# Patient Record
Sex: Female | Born: 1955 | Race: White | Hispanic: No | Marital: Married | Smoking: Never smoker
Health system: Southern US, Community
[De-identification: ages and names within clinical notes are randomized; demographics above are authoritative.]

## PROBLEM LIST (undated history)

## (undated) DIAGNOSIS — I471 Supraventricular tachycardia, unspecified: Secondary | ICD-10-CM

## (undated) DIAGNOSIS — F419 Anxiety disorder, unspecified: Secondary | ICD-10-CM

## (undated) HISTORY — DX: Supraventricular tachycardia, unspecified: I47.10

## (undated) HISTORY — PX: OTHER SURGICAL HISTORY: SHX169

## (undated) HISTORY — DX: Supraventricular tachycardia: I47.1

## (undated) HISTORY — DX: Anxiety disorder, unspecified: F41.9

---

## 2020-05-26 ENCOUNTER — Other Ambulatory Visit: Payer: Self-pay | Admitting: Internal Medicine

## 2020-05-26 DIAGNOSIS — R000 Tachycardia, unspecified: Secondary | ICD-10-CM

## 2020-06-04 HISTORY — PX: TRANSTHORACIC ECHOCARDIOGRAM: SHX275

## 2020-06-10 ENCOUNTER — Other Ambulatory Visit: Payer: Self-pay

## 2020-06-17 ENCOUNTER — Encounter: Payer: Self-pay | Admitting: Cardiology

## 2020-06-17 ENCOUNTER — Ambulatory Visit (INDEPENDENT_AMBULATORY_CARE_PROVIDER_SITE_OTHER): Payer: 59 | Admitting: Cardiology

## 2020-06-17 ENCOUNTER — Other Ambulatory Visit: Payer: Self-pay

## 2020-06-17 VITALS — BP 138/74 | HR 65 | Ht 63.0 in | Wt 141.0 lb

## 2020-06-17 DIAGNOSIS — E785 Hyperlipidemia, unspecified: Secondary | ICD-10-CM | POA: Diagnosis not present

## 2020-06-17 DIAGNOSIS — R002 Palpitations: Secondary | ICD-10-CM

## 2020-06-17 NOTE — Patient Instructions (Addendum)
Medication Instructions:  no changes  *If you need a refill on your cardiac medications before your next appointment, please call your pharmacy*   Lab Work:  Not needed   Testing/Procedures:  CT coronary calcium score. This test is done at [address]. This is $99 out of pocket.   Coronary CalciumScan A coronary calcium scan is an imaging test used to look for deposits of calcium and other fatty materials (plaques) in the inner lining of the blood vessels of the heart (coronary arteries). These deposits of calcium and plaques can partly clog and narrow the coronary arteries without producing any symptoms or warning signs. This puts a person at risk for a heart attack. This test can detect these deposits before symptoms develop. Tell a health care provider about:  Any allergies you have.  All medicines you are taking, including vitamins, herbs, eye drops, creams, and over-the-counter medicines.  Any problems you or family members have had with anesthetic medicines.  Any blood disorders you have.  Any surgeries you have had.  Any medical conditions you have.  Whether you are pregnant or may be pregnant. What are the risks? Generally, this is a safe procedure. However, problems may occur, including:  Harm to a pregnant woman and her unborn baby. This test involves the use of radiation. Radiation exposure can be dangerous to a pregnant woman and her unborn baby. If you are pregnant, you generally should not have this procedure done.  Slight increase in the risk of cancer. This is because of the radiation involved in the test. What happens before the procedure? No preparation is needed for this procedure. What happens during the procedure?  You will undress and remove any jewelry around your neck or chest.  You will put on a hospital gown.  Sticky electrodes will be placed on your chest. The electrodes will be connected to an electrocardiogram (ECG)  machine to record a tracing of the electrical activity of your heart.  A CT scanner will take pictures of your heart. During this time, you will be asked to lie still and hold your breath for 2-3 seconds while a picture of your heart is being taken. The procedure may vary among health care providers and hospitals. What happens after the procedure?  You can get dressed.  You can return to your normal activities.  It is up to you to get the results of your test. Ask your health care provider, or the department that is doing the test, when your results will be ready. Summary  A coronary calcium scan is an imaging test used to look for deposits of calcium and other fatty materials (plaques) in the inner lining of the blood vessels of the heart (coronary arteries).  Generally, this is a safe procedure. Tell your health care provider if you are pregnant or may be pregnant.  No preparation is needed for this procedure.  A CT scanner will take pictures of your heart.  You can return to your normal activities after the scan is done. This information is not intended to replace advice given to you by your health care provider. Make sure you discuss any questions you have with your health care provider. Document Released: 09/24/2007 Document Revised: 02/15/2016 Document Reviewed: 02/15/2016 Elsevier Interactive Patient Education  2017 [employer].     Follow-Up: At [facility], you and your health needs are our priority.  As part of our continuing mission to provide you with exceptional heart care, we  have created designated Provider Care Teams.  These Care Teams include your primary Cardiologist (physician) and Advanced Practice Providers (APPs -  Physician Assistants and Nurse Practitioners) who all work together to provide you with the care you need, when you need it.  We recommend signing up for the patient portal called "MyChart".  Sign up information is provided on this After Visit  Summary.  MyChart is used to connect with patients for Virtual Visits (Telemedicine).  Patients are able to view lab/test results, encounter notes, upcoming appointments, etc.  Non-urgent messages can be sent to your provider as well.   To learn more about what you can do with MyChart, go to [email or web address].    Your next appointment:      1  month(s)  The format for your next appointment:   In Person  Provider:   Bryan Lemma, MD   Other Instructions  Will obtain the information from Dr Renne Crigler office - Zio , Cardiac CT scoring , echo disc

## 2020-06-17 NOTE — Progress Notes (Signed)
Primary Care Provider: Merri Brunette, MD Cardiologist: Bryan Lemma, MD Electrophysiologist: None  Clinic Note: Chief Complaint  Patient presents with  . New Patient (Initial Visit)    Referred by Dr. Renne Crigler for palpitations  . Palpitations    ===================================  ASSESSMENT/PLAN   Problem List Items Addressed This Visit    Palpitations - Primary    Unfortunately, I do not have the results of her monitor.  Her echo was totally normal.  I think one potential etiology for her palpitations is the fact that she is hyperdynamic left ventricle.  We talked at the importance of staying adequately hydrated.  We also advised avoiding triggers for palpitations.  Her symptoms sound to me like she is probably feeling some PVCs and then with short little bursts of PAT or atrial tachycardia.  The term SVT is probably not the correct term to use since traditionally "SVTs" are usually more sustained reentrant rhythms such as AVRT or AVNRT which usually last more than just a few seconds.  Plan: Continue recommended hydration.  We will get the results of her monitor to review and follow-up..      Relevant Orders   EKG 12-Lead (Completed)   CT CARDIAC SCORING (SELF PAY ONLY)   Dyslipidemia, goal to be determined    PCP suggested coronary calcium score.  She is also debating whether or not she gets the executive panel of carotid Dopplers, AAA screen and lower extremity arterial screening. I think probably the first reasonable screening test for coronary disease risk is a coronary calcium score.  Once that is checked, we can discuss further studies which may be beneficial.  For now, she is agreeable to proceed with coronary calcium score.    She will return to discuss results of the coronary calcium score and the monitor once I have the results.         ===================================  HPI:    Kelli Taylor is a 65 y.o. female with a no really significant  medical history who presents today for evaluation of PALPITATIONS at the request of Merri Brunette, MD.  Kelli Taylor was seen on May 26, 2020 by Dr. Renne Crigler with complaints of palpitation.  She is noticing these for about 3 years but may be getting worse.  Was having some fast heart rates associated dizziness. => CMP, TSH, CBC and lipid panel checked along with echocardiogram and Zio patch monitor.  Coronary calcium score ordered, but she wanted to talk to cardiologist first. -> Unfortunately, Zio patch results are not available.  She was started on sertraline 50 mg daily  Recent Hospitalizations: None  Reviewed  CV studies:    The following studies were reviewed today: (if available, images/films reviewed: From Epic Chart or Care Everywhere) . Transthoracic Echo [facility]) 06/04/2020: Normal left ventricular size and function.  EF estimated 7075%.  GR 1 DD.  Mild LA dilation.  Mild aortic sclerosis, no stenosis.  Although valve not well seen.  Mitral valve is structurally normal with trace MR, mild leaflet calcification but no stenosis and no mitral valve prolapse.   Interval History:   Kelli Taylor presents here today at the request of Dr. Renne Crigler.  She has multiple questions complaints and concerns.  She has had several tests done.  Unfortunately I do not have the results of her Zio patch done.  Also noted that there is suggestion of possible SVT.  I suspect that this is actually probably just runs  of PAT.  This is based on her description of symptoms.  I spent the better part 45 minutes explaining the pathophysiology of PVCs, PACs, PAT, PAF, atrial flutter, SVT and VT.  We reviewed the results of her echocardiogram together.  I explained to her that she had previously had a diagnosis of mitral prolapse which was not supported by this echocardiogram.  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  Several complaints:  Feelings of palpitations lasting a few seconds.  It is more of a pounding sensation, associated with dizziness.  Sometimes the heart rate goes fast but most time is just very forceful beats.  The fast spells do not last very long.  She is able to usually break them by pushing on her radial artery.  This usually resets her rhythm. =>   These episodes are not associated with chest pain or shortness of breath.  Just a little bit of dizziness.  She says these episodes of become more persistent and and worrisome as he has gotten older, but usually associated with anxiety.   She is also concerned about possible ischemic etiologies of the symptoms, Dr. Renne CriglerPharr had recommended screening studies with coronary calcium score, and she is deciding whether or not she wants to try to do one of the executive panel to look at arterial Dopplers.  We discussed the studies and what they mean.  A lot of her anxiety revolves around her being concerned about her mother's health now.  She also has had concerns about work, the pandemic etc.  She is not doing the routine exercise type activities he was doing before therefore feeling more sedentary.  She is usually pretty active, but not exercising routinely.  She denies any active chest tightness or pressure symptoms.  No PND orthopnea or edema.  Although she has these pounding heart sensations and fluttering, but nothing is prolonged.  Nothing necessarily irregular.  Besides feeling a little bit lightheaded and anxious about these symptoms, she is not really having syncope or near syncope.  CV Review of Symptoms (Summary): positive for - irregular heartbeat, palpitations, rapid heart rate and Short bursts of fast heart rates, mostly forceful pounding sensation in her chest.  Made worse with anxiousness.  She sometimes gets short of breath but not  necessarily dizzy or woozy. negative for - chest pain, dyspnea on exertion, edema, orthopnea, paroxysmal nocturnal dyspnea, shortness of breath or Syncope/near syncope, TIA/amaurosis fugax, claudication  The patient does not have symptoms concerning for COVID-19 infection (fever, chills, cough, or new shortness of breath).   REVIEWED OF SYSTEMS   Review of Systems  Constitutional: Negative for malaise/fatigue and weight loss.  HENT: Negative for congestion.   Respiratory: Negative for cough and shortness of breath.   Cardiovascular: Positive for palpitations.  Gastrointestinal: Negative for blood in stool and melena.  Genitourinary: Negative for hematuria.  Musculoskeletal: Negative for joint pain.  Neurological: Positive for dizziness. Negative for focal weakness, weakness and headaches.  Psychiatric/Behavioral: Negative for depression and memory loss. The patient is nervous/anxious. The patient does not have insomnia.     I have reviewed and (if needed) personally updated the patient's problem list, medications, allergies, past medical and surgical history, social and family history.   PAST MEDICAL HISTORY   Past Medical History:  Diagnosis Date  . Anxiety disorder   .  Dyslipidemia, goal to be determined 06/18/2020  . SVT (supraventricular tachycardia) (HCC)    By report    PAST SURGICAL HISTORY   Past Surgical History:  Procedure Laterality Date  . None       There is no immunization history on file for this patient.  MEDICATIONS/ALLERGIES   Current Meds  Medication Sig  . Calcium-Cholecalciferol-Zinc (VIACTIV CALCIUM IMMUNE PO) Take by mouth.  . Multiple Vitamin (MULTIVITAMIN) tablet Take 1 tablet by mouth daily.  . multivitamin-lutein (OCUVITE-LUTEIN) CAPS capsule Take 1 capsule by mouth daily.  . [DISCONTINUED] sertraline (ZOLOFT) 50 MG tablet 1/2  tablet for 4 days then 1 each morning    Allergies  Allergen Reactions  . Cinnamon Swelling    SOCIAL  HISTORY/FAMILY HISTORY   Reviewed in Epic:  Pertinent findings:  Social History   Tobacco Use  . Smoking status: Never Smoker  . Smokeless tobacco: Never Used  Substance Use Topics  . Alcohol use: Not Currently    Comment: Rare social use   Social History   Social History Narrative  . Not on file   family history includes COPD in her mother; Diabetes Mellitus II in her father; Heart disease in her father; Hypercholesterolemia in her sister.   OBJCTIVE -PE, EKG, labs   Wt Readings from Last 3 Encounters:  06/17/20 141 lb (64 kg)    Physical Exam: BP 138/74   Pulse 65   Ht 5\' 3"  (1.6 m)   Wt 141 lb (64 kg)   SpO2 98%   BMI 24.98 kg/m  Physical Exam Constitutional:      General: She is not in acute distress.    Appearance: Normal appearance. She is normal weight. She is not ill-appearing or toxic-appearing.  HENT:     Head: Normocephalic and atraumatic.  Neck:     Vascular: No carotid bruit.  Cardiovascular:     Rate and Rhythm: Normal rate and regular rhythm.     Pulses: Normal pulses.     Heart sounds: Normal heart sounds. No murmur heard. No friction rub. No gallop.   Pulmonary:     Effort: Pulmonary effort is normal. No respiratory distress.     Breath sounds: Normal breath sounds.  Chest:     Chest wall: No tenderness.  Abdominal:     General: Abdomen is flat. Bowel sounds are normal. There is no distension.     Palpations: Abdomen is soft. There is no mass.     Comments: No HSM or bruit  Musculoskeletal:        General: No swelling or tenderness. Normal range of motion.     Cervical back: Normal range of motion and neck supple.  Skin:    General: Skin is warm and dry.  Neurological:     General: No focal deficit present.     Mental Status: She is alert and oriented to person, place, and time.     Cranial Nerves: No cranial nerve deficit.     Motor: No weakness.     Gait: Gait normal.  Psychiatric:        Mood and Affect: Mood normal.         Thought Content: Thought content normal.     Comments: Very anxious, almost pressured speech.     Adult ECG Report  Rate: 65 ;  Rhythm: normal sinus rhythm and Possible left atrial enlargement.  (Computer read of cannot rule out anterior infarct, age-indeterminate is not accurate); normal axis, intervals and durations.  Narrative  Interpretation: Borderline EKG.  PCP EKG shows sinus rhythm, rate 90 bpm.  Questionable left atrial enlargement.  Otherwise essentially normal EKG.  Recent Labs: 05/26/2020  Na+ 142, K+ 4.2, Cl- 104, HCO3-21, BUN 16, Cr 0.76, Glu 90, Ca2+ 9.6; AST 57, ALT 18, AlkP 19  CBC: W 11.1, H/H 14.2/42.9, Plt 374; TSH 1.37  TC 194, TG 127, HDL 31.7 (HDL-P total 31.7 -normal), LDL 107; LDL-P 1239 (high), small LDL-P4 404 (target range<527.  LDL size 20.8 (goal> 20.5)  No results found for: CHOL, HDL, LDLCALC, LDLDIRECT, TRIG, CHOLHDL No results found for: CREATININE, BUN, NA, K, CL, CO2 No flowsheet data found.  No results found for: TSH  ==================================================  COVID-19 Education: The signs and symptoms of COVID-19 were discussed with the patient and how to seek care for testing (follow up with PCP or arrange E-visit).   The importance of social distancing and COVID-19 vaccination was discussed today. The patient is practicing social distancing & Masking.   I spent a total of with the patient spent in direct patient consultation.  Additional time spent with chart review  / charting (studies, outside notes, etc): 35 min Total Time: 80 min   Current medicines are reviewed at length with the patient today.  (+/- concerns) n/a  This visit occurred during the SARS-CoV-2 public health emergency.  Safety protocols were in place, including screening questions prior to the visit, additional usage of staff PPE, and extensive cleaning of exam room while observing appropriate contact time as indicated for disinfecting  solutions.  Notice: This dictation was prepared with Dragon dictation along with smaller phrase technology. Any transcriptional errors that result from this process are unintentional and may not be corrected upon review.  Patient Instructions / Medication Changes & Studies & Tests Ordered   Patient Instructions  Medication Instructions:  no changes  *If you need a refill on your cardiac medications before your next appointment, please call your pharmacy*   Lab Work:  Not needed   Testing/Procedures:  CT coronary calcium score. This test is done at [address]. This is $99 out of pocket.   Coronary CalciumScan A coronary calcium scan is an imaging test used to look for deposits of calcium and other fatty materials (plaques) in the inner lining of the blood vessels of the heart (coronary arteries). These deposits of calcium and plaques can partly clog and narrow the coronary arteries without producing any symptoms or warning signs. This puts a person at risk for a heart attack. This test can detect these deposits before symptoms develop. Tell a health care provider about:  Any allergies you have.  All medicines you are taking, including vitamins, herbs, eye drops, creams, and over-the-counter medicines.  Any problems you or family members have had with anesthetic medicines.  Any blood disorders you have.  Any surgeries you have had.  Any medical conditions you have.  Whether you are pregnant or may be pregnant. What are the risks? Generally, this is a safe procedure. However, problems may occur, including:  Harm to a pregnant woman and her unborn baby. This test involves the use of radiation. Radiation exposure can be dangerous to a pregnant woman and her unborn baby. If you are pregnant, you generally should not have this procedure done.  Slight increase in the risk of cancer. This is because of the radiation involved in the test. What happens before  the procedure? No preparation is needed for this procedure. What happens during the  procedure?  You will undress and remove any jewelry around your neck or chest.  You will put on a hospital gown.  Sticky electrodes will be placed on your chest. The electrodes will be connected to an electrocardiogram (ECG) machine to record a tracing of the electrical activity of your heart.  A CT scanner will take pictures of your heart. During this time, you will be asked to lie still and hold your breath for 2-3 seconds while a picture of your heart is being taken. The procedure may vary among health care providers and hospitals. What happens after the procedure?  You can get dressed.  You can return to your normal activities.  It is up to you to get the results of your test. Ask your health care provider, or the department that is doing the test, when your results will be ready. Summary  A coronary calcium scan is an imaging test used to look for deposits of calcium and other fatty materials (plaques) in the inner lining of the blood vessels of the heart (coronary arteries).  Generally, this is a safe procedure. Tell your health care provider if you are pregnant or may be pregnant.  No preparation is needed for this procedure.  A CT scanner will take pictures of your heart.  You can return to your normal activities after the scan is done. This information is not intended to replace advice given to you by your health care provider. Make sure you discuss any questions you have with your health care provider. Document Released: 09/24/2007 Document Revised: 02/15/2016 Document Reviewed: 02/15/2016 Elsevier Interactive Patient Education  2017 [employer].     Follow-Up: At [facility], you and your health needs are our priority.  As part of our continuing mission to provide you with exceptional heart care, we have created designated Provider Care Teams.  These Care Teams include your  primary Cardiologist (physician) and Advanced Practice Providers (APPs -  Physician Assistants and Nurse Practitioners) who all work together to provide you with the care you need, when you need it.  We recommend signing up for the patient portal called "MyChart".  Sign up information is provided on this After Visit Summary.  MyChart is used to connect with patients for Virtual Visits (Telemedicine).  Patients are able to view lab/test results, encounter notes, upcoming appointments, etc.  Non-urgent messages can be sent to your provider as well.   To learn more about what you can do with MyChart, go to [email or web address].    Your next appointment:      1  month(s)  The format for your next appointment:   In Person  Provider:   Bryan Lemma, MD   Other Instructions  Will obtain the information from Dr Renne Crigler office - [facility] , Cardiac CT scoring , echo disc     Studies Ordered:   Orders Placed This Encounter  Procedures  . CT CARDIAC SCORING (SELF PAY ONLY)  . EKG 12-Lead     Bryan Lemma, M.D., M.S. Interventional Cardiologist   Pager # [phone] Phone # [phone] [address] [place], [place] [address]   Thank you for choosing Heart[facility] at [facility]!!

## 2020-06-18 ENCOUNTER — Encounter: Payer: Self-pay | Admitting: Cardiology

## 2020-06-18 DIAGNOSIS — R002 Palpitations: Secondary | ICD-10-CM | POA: Insufficient documentation

## 2020-06-18 DIAGNOSIS — E785 Hyperlipidemia, unspecified: Secondary | ICD-10-CM

## 2020-06-18 HISTORY — DX: Hyperlipidemia, unspecified: E78.5

## 2020-06-18 NOTE — Assessment & Plan Note (Signed)
PCP suggested coronary calcium score.  She is also debating whether or not she gets the executive panel of carotid Dopplers, AAA screen and lower extremity arterial screening. I think probably the first reasonable screening test for coronary disease risk is a coronary calcium score.  Once that is checked, we can discuss further studies which may be beneficial.  For now, she is agreeable to proceed with coronary calcium score.    She will return to discuss results of the coronary calcium score and the monitor once I have the results.

## 2020-06-18 NOTE — Assessment & Plan Note (Signed)
Unfortunately, I do not have the results of her monitor.  Her echo was totally normal.  I think one potential etiology for her palpitations is the fact that she is hyperdynamic left ventricle.  We talked at the importance of staying adequately hydrated.  We also advised avoiding triggers for palpitations.  Her symptoms sound to me like she is probably feeling some PVCs and then with short little bursts of PAT or atrial tachycardia.  The term SVT is probably not the correct term to use since traditionally "SVTs" are usually more sustained reentrant rhythms such as AVRT or AVNRT which usually last more than just a few seconds.  Plan: Continue recommended hydration.  We will get the results of her monitor to review and follow-up.Marland Kitchen

## 2020-06-26 ENCOUNTER — Ambulatory Visit (INDEPENDENT_AMBULATORY_CARE_PROVIDER_SITE_OTHER)
Admission: RE | Admit: 2020-06-26 | Discharge: 2020-06-26 | Disposition: A | Discharge status: Home / Self Care | Payer: Self-pay | Source: Ambulatory Visit | Attending: Cardiology | Admitting: Cardiology

## 2020-06-26 ENCOUNTER — Other Ambulatory Visit: Payer: Self-pay

## 2020-06-26 DIAGNOSIS — R002 Palpitations: Secondary | ICD-10-CM

## 2020-06-26 HISTORY — PX: OTHER SURGICAL HISTORY: SHX169

## 2020-07-03 ENCOUNTER — Telehealth: Payer: Self-pay | Admitting: *Deleted

## 2020-07-03 NOTE — Telephone Encounter (Signed)
Called  Informed patient that Dr Herbie Baltimore has received the report and echo disc  Once he  Gives the RN information will contact patient.  she verbalized understanding.

## 2020-07-03 NOTE — Telephone Encounter (Signed)
-----   Message from Charna Elizabeth, LPN sent at 2/62/0355 12:22 PM EDT ----- Jasmine December this pt wanted to know if you received her zio monitor and echo disc

## 2020-07-06 NOTE — Telephone Encounter (Signed)
Trying to find a machine that can actually open up the images. Bryan Lemma, MD

## 2020-08-24 ENCOUNTER — Encounter: Payer: Self-pay | Admitting: Cardiology

## 2020-08-24 ENCOUNTER — Ambulatory Visit (INDEPENDENT_AMBULATORY_CARE_PROVIDER_SITE_OTHER): Payer: 59 | Admitting: Cardiology

## 2020-08-24 ENCOUNTER — Other Ambulatory Visit: Payer: Self-pay

## 2020-08-24 VITALS — BP 126/68 | HR 71 | Ht 60.0 in | Wt 140.0 lb

## 2020-08-24 DIAGNOSIS — R002 Palpitations: Secondary | ICD-10-CM

## 2020-08-24 DIAGNOSIS — E785 Hyperlipidemia, unspecified: Secondary | ICD-10-CM | POA: Diagnosis not present

## 2020-08-24 NOTE — Patient Instructions (Addendum)
.  Medication Instructions:  No changes  *If you need a refill on your cardiac medications before your next appointment, please call your pharmacy*   Lab Work:  [facility] LIPID- fasting  Dec 2022 If you have labs (blood work) drawn today and your tests are completely normal, you will receive your results only by: Marland Kitchen MyChart Message (if you have MyChart) OR . A paper copy in the mail If you have any lab test that is abnormal or we need to change your treatment, we will call you to review the results.   Testing/Procedures:  Not needed  Follow-Up: At [facility], you and your health needs are our priority.  As part of our continuing mission to provide you with exceptional heart care, we have created designated Provider Care Teams.  These Care Teams include your primary Cardiologist (physician) and Advanced Practice Providers (APPs -  Physician Assistants and Nurse Practitioners) who all work together to provide you with the care you need, when you need it.     Your next appointment:   6 month(s)  The format for your next appointment:   In Person  Provider:   Bryan Lemma, MD   Other Instructions Your physician discussed the importance of regular exercise and diet  recommended that you start or continue a regular exercise program for good health.   think about  VASCUscreen   Screening exams  For  Carotid artery disease, Abdominal Aortic Aneurysm, Peripheral Artery Disease  $50 per exam or $125 for all ( no referral necessary, 30 minute exam)

## 2020-08-24 NOTE — Progress Notes (Signed)
Primary Care Provider: Merri Brunette, MD Cardiologist: Bryan Lemma, MD Electrophysiologist: None  Clinic Note: Chief Complaint  Patient presents with   Follow-up    Overall doing better.  Less notable palpitations.    ===================================  ASSESSMENT/PLAN   Problem List Items Addressed This Visit     Palpitations (Chronic)    Review of the monitor indicates that indeed these are probably not true SVT spells.  6 short episodes with the longest 1 being 17 beats would be more consistent with atrial tachycardia bursts.  SVT tends to be more sustained and prolonged.  Regardless, we discussed the importance of avoiding triggers and vagal maneuvers to avoid prolonged spells.  Also encouraged adequate hydration.  Based on the fact that her symptoms have improved and are not likely significant arrhythmias, I would suggest that we do not consider AV nodal agents at this time.       Relevant Orders   Lipid panel   Comprehensive metabolic panel   Dyslipidemia, goal LDL below 100 - Primary (Chronic)    Coronary calcium score is extremely low.  As such, she would be considered relatively low risk and target LDL is not unreasonable to be less than 130, however based on the patient's concerns, I would target at least LDL less than 100 with a goal of reaching less than 70.  Her LDL size is a stable pattern but just barely.  For now, she would like a trial of lifestyle modification with adjusting her diet.  She will try potentially red yeast rice and co-Q10 along with diet and exercise.  We will recheck screening fasting lipid panel in December.  If not at goal by that time, I would consider low-dose statin.  Would not recommend aspirin.  She is also going to consider going forward with the vascular screening studies including carotid Dopplers, AAA screen and ABIs.       Relevant Orders   Lipid panel   Comprehensive metabolic panel     ===================================  HPI:    Kelli Taylor is a 65 y.o. female with a PMH notable for nonsustained PAT/SVT on monitor, and borderline  HLD who presents today for 46-month follow-up.  Kelli Taylor was last seen on June 17, 2020 for initial consultation for palpitations.  At that time I did not have her Zio patch monitor results, and there was report of possible SVT--mention that her palpitations would last a few seconds with a feeling of forceful beats and sometimes fast heart rate.  Sometimes associated with dizziness..  She also had an echocardiogram performed at Dr. Carolee Rota office reviewed below past surgical history.  Unfortunately I was not able to pull up the images from the provided CD. -> Would prefer to avoid medications for her palpitations as they were relatively benign.  Discussed importance of hydration and avoiding triggers.  Discussed vagal maneuvers. -> She was concerned about her lipids.  She was concerned about getting a cardiovascular screening done.  In order to help her stratify we chose to proceed with coronary calcium score.  She is still considering carotid Dopplers and AAA screening along with ABIs.  (VASC screen)  Recent Hospitalizations: None  Reviewed  CV studies:    The following studies were reviewed today: (if available, images/films reviewed: From Epic Chart or Care Everywhere) Coronary Calcium Score 06/26/2020: CCS 0.861 - speck of calcium in the mid LCx. -- 55th percentile for age.    Interval History:   Kelli Taylor returns here  today actually feeling a lot better.  The palpitations have been notably improved and she has not anything prolonged.  She does indicate that her anxiety level and stress has notably improved and that may be part of the reason why her palpitations have improved.  She is watching out for triggers, avoiding caffeine.  She may notice a few skipped beats or pounding symptoms, but no tachycardia symptoms.  No rapid  or irregular heartbeats. She did suffer a fall where she lost her balance going up the stairs, but has not had any significant injury from that.  Otherwise she denies any active cardiac symptoms or exertional dyspnea or chest pain.  No PND, orthopnea or edema.  Although she had a fall, she had no syncope or near syncope.  No TIA or amaurosis fugax symptoms.   REVIEWED OF SYSTEMS   Review of Systems  Constitutional:  Negative for malaise/fatigue and weight loss.  HENT:  Negative for congestion and nosebleeds.   Respiratory:  Negative for cough and shortness of breath.   Cardiovascular:  Positive for palpitations (Notably improved).  Gastrointestinal:  Negative for blood in stool and melena.  Genitourinary:  Negative for hematuria.  Musculoskeletal:  Positive for falls (Noted in HPI.). Negative for back pain and joint pain.       She has some discomfort from her fall, but no major injuries  Neurological:  Negative for dizziness, focal weakness and headaches.  Psychiatric/Behavioral:  Negative for depression and memory loss. The patient is nervous/anxious (Notably improved). The patient does not have insomnia.    I have reviewed and (if needed) personally updated the patient's problem list, medications, allergies, past medical and surgical history, social and family history.   PAST MEDICAL HISTORY   Past Medical History:  Diagnosis Date   Anxiety disorder    Dyslipidemia, goal to be determined 06/18/2020   Paroxysmal - nonsustained  SVT (supraventricular tachycardia) (HCC)    7-DAY ZIO PATCH CARDIAC EVENT MONITOR: 6 brief bursts of SVT/PAT.  Fastest was 9 beats at a rate of 156 bpm, longest 17 beats rate 137 bpm. --> NO SUSTAINED TACHYARRHYTHMIAS.(i.e. no SUSTAINTED SVT / PAT).  Rare isolated PACs or PVCs.  Short bursts of PAT were not noted on diary or triggered patient events.    PAST SURGICAL HISTORY   Past Surgical History:  Procedure Laterality Date   CORONARY CALCIUM SCORE   06/26/2020   CCS 0.861 --> LOW.  speck of Ca2+ noted in mLCx. ~ 55th Percentile.   None     TRANSTHORACIC ECHOCARDIOGRAM  06/04/2020   [facility]): Normal left ventricular size and function.  EF estimated 7075%.  GR 1 DD.  Mild LA dilation.  Mild aortic sclerosis, no stenosis.  Although valve not well seen.  Mitral valve is structurally normal with trace MR, mild leaflet calcification but no stenosis and no mitral valve prolapse.    There is no immunization history on file for this patient.  MEDICATIONS/ALLERGIES   Current Meds  Medication Sig   Calcium-Cholecalciferol-Zinc (VIACTIV CALCIUM IMMUNE PO) Take by mouth.   Multiple Vitamin (MULTIVITAMIN) tablet Take 1 tablet by mouth daily.   Multiple Vitamins-Minerals (EMERGEN-C BLUE) PACK See admin instructions.   multivitamin-lutein (OCUVITE-LUTEIN) CAPS capsule Take 1 capsule by mouth daily.   Pseudophed-Chlophedianol-GG 30-12.5-100 MG/5ML LIQD Take by mouth.    Allergies  Allergen Reactions   Cinnamon Swelling    SOCIAL HISTORY/FAMILY HISTORY   Reviewed in Epic:  Pertinent findings:  Social History   Tobacco Use  Smoking status: Never   Smokeless tobacco: Never  Substance Use Topics   Alcohol use: Not Currently    Comment: Rare social use   Social History   Social History Narrative   Not on file    OBJCTIVE -PE, EKG, labs   Wt Readings from Last 3 Encounters:  08/24/20 140 lb (63.5 kg)  06/17/20 141 lb (64 kg)    Physical Exam: BP 126/68   Pulse 71   Ht 5' (1.524 m)   Wt 140 lb (63.5 kg)   SpO2 97%   BMI 27.34 kg/m  Physical Exam Vitals reviewed.  Constitutional:      General: She is not in acute distress.    Appearance: Normal appearance. She is normal weight. She is not ill-appearing or toxic-appearing.  HENT:     Head: Normocephalic and atraumatic.  Neck:     Vascular: No carotid bruit, hepatojugular reflux or JVD.  Cardiovascular:     Rate and Rhythm: Normal rate and  regular rhythm. No extrasystoles are present.    Chest Wall: PMI is not displaced.     Pulses: Normal pulses.     Heart sounds: No midsystolic click. No murmur heard.   No friction rub. No gallop.  Pulmonary:     Effort: Pulmonary effort is normal. No respiratory distress.     Breath sounds: Normal breath sounds. No wheezing, rhonchi or rales.  Chest:     Chest wall: No tenderness.  Musculoskeletal:        General: No swelling. Normal range of motion.     Cervical back: Normal range of motion and neck supple.  Skin:    General: Skin is warm and dry.  Neurological:     General: No focal deficit present.     Mental Status: She is alert and oriented to person, place, and time.  Psychiatric:        Mood and Affect: Mood normal.        Behavior: Behavior normal.        Thought Content: Thought content normal.        Judgment: Judgment normal.     Adult ECG Report N/a  Recent Labs:   05/27/2020: LDL-P 1239 (moderate), LDL-C (NIH-Calc) 107 (above optimal), Small LDL-P 404 (goal < 527), LDL Size 20.8 (goal 20.5 -> pattern A)  HDL-C 65, HDL-P (total) 31.7 (goal >30.5).  TG 127, TC 194.  LP-IR score <25 (low insulin resistance score) 08/21/2020: TC 185, TG 61, HDL 67, LDL 106   No results found for: CHOL, HDL, LDLCALC, LDLDIRECT, TRIG, CHOLHDL No results found for: CREATININE, BUN, NA, K, CL, CO2 No flowsheet data found.  No results found for: TSH  ==================================================  COVID-19 Education: The signs and symptoms of COVID-19 were discussed with the patient and how to seek care for testing (follow up with PCP or arrange E-visit).    I spent a total of 41 minutes with the patient spent in direct patient consultation.  --> Patient had multiple questions.  Despite feeling much better, she is very concerned about her underlying cardiovascular risk.  We reviewed her lipid panels and discussed whether not she should be on a statin.  I discussed with her that her  coronary calcium score is extremely low, and would not necessarily recommend aspirin, but treating for an LDL goal of less than 100 if not closer to 70 is always reasonable target to avoid progression of disease. Additional time spent with chart review  / charting (studies, outside  notes, etc): 15 min -> Coronary calcium score, event monitor and labs reviewed.  History updated Total Time: 56 min   Current medicines are reviewed at length with the patient today.  (+/- concerns) n/a  This visit occurred during the SARS-CoV-2 public health emergency.  Safety protocols were in place, including screening questions prior to the visit, additional usage of staff PPE, and extensive cleaning of exam room while observing appropriate contact time as indicated for disinfecting solutions.  Notice: This dictation was prepared with Dragon dictation along with smaller phrase technology. Any transcriptional errors that result from this process are unintentional and may not be corrected upon review.  Patient Instructions / Medication Changes & Studies & Tests Ordered   Patient Instructions  .Medication Instructions:  No changes  *If you need a refill on your cardiac medications before your next appointment, please call your pharmacy*   Lab Work:  [facility] LIPID- fasting  Dec 2022 If you have labs (blood work) drawn today and your tests are completely normal, you will receive your results only by: MyChart Message (if you have MyChart) OR A paper copy in the mail If you have any lab test that is abnormal or we need to change your treatment, we will call you to review the results.   Testing/Procedures:  Not needed  Follow-Up: At [facility], you and your health needs are our priority.  As part of our continuing mission to provide you with exceptional heart care, we have created designated Provider Care Teams.  These Care Teams include your primary Cardiologist (physician) and Advanced Practice Providers  (APPs -  Physician Assistants and Nurse Practitioners) who all work together to provide you with the care you need, when you need it.     Your next appointment:   6 month(s)  The format for your next appointment:   In Person  Provider:   Bryan Lemmaavid Yevette Knust, MD   Other Instructions Your physician discussed the importance of regular exercise and diet  recommended that you start or continue a regular exercise program for good health.   think about  VASCUscreen   Screening exams  For  Carotid artery disease, Abdominal Aortic Aneurysm, Peripheral Artery Disease  $50 per exam or $125 for all ( no referral necessary, 30 minute exam)    Studies Ordered:   Orders Placed This Encounter  Procedures   Lipid panel   Comprehensive metabolic panel     Bryan Lemmaavid Shelsy Seng, M.D., M.S. Interventional Cardiologist   Pager # [phone] Phone # [phone] [address] [place], [place] [address]   Thank you for choosing [facility] at [facility]!!

## 2020-08-25 NOTE — Telephone Encounter (Signed)
Patient discuss with patient at office appointment 08/24/20

## 2020-09-17 ENCOUNTER — Encounter: Payer: Self-pay | Admitting: Cardiology

## 2020-09-17 NOTE — Assessment & Plan Note (Signed)
Coronary calcium score is extremely low.  As such, she would be considered relatively low risk and target LDL is not unreasonable to be less than 130, however based on the patient's concerns, I would target at least LDL less than 100 with a goal of reaching less than 70.  Her LDL size is a stable pattern but just barely.  For now, she would like a trial of lifestyle modification with adjusting her diet.  She will try potentially red yeast rice and co-Q10 along with diet and exercise.  We will recheck screening fasting lipid panel in December.  If not at goal by that time, I would consider low-dose statin.  Would not recommend aspirin.  She is also going to consider going forward with the vascular screening studies including carotid Dopplers, AAA screen and ABIs.

## 2020-09-17 NOTE — Assessment & Plan Note (Signed)
Review of the monitor indicates that indeed these are probably not true SVT spells.  6 short episodes with the longest 1 being 17 beats would be more consistent with atrial tachycardia bursts.  SVT tends to be more sustained and prolonged.  Regardless, we discussed the importance of avoiding triggers and vagal maneuvers to avoid prolonged spells.  Also encouraged adequate hydration.  Based on the fact that her symptoms have improved and are not likely significant arrhythmias, I would suggest that we do not consider AV nodal agents at this time.

## 2021-08-15 IMAGING — CT CT CARDIAC CORONARY ARTERY CALCIUM SCORE
3 series · 14 of 20 positions shown, 15 images · non-contrast
Comparison: None.
COMPARISON: None.

Addendum:
EXAM:
OVER-READ INTERPRETATION  CT CHEST

The following report is an over-read performed by radiologist Dr.
Pepenk Langga [REDACTED] on 06/26/2020. This
over-read does not include interpretation of cardiac or coronary
anatomy or pathology. The coronary calcium score interpretation by
the cardiologist is attached.
CLINICAL DATA: Risk stratification
Coronary Calcium Score
TECHNIQUE: The patient was scanned on a Siemens Force scanner. Axial
non-contrast 3 mm slices were carried out through the heart. The
data set was analyzed on a dedicated work station and scored using
the Agatson method.

[Series 2: casc 3.0 bv41 2 bestdiast 70 % · axial · 0.38mm/px · z∈[-176,-95]mm · 4 of 45 slices shown, 5 images]
[im 9/45  vessel]
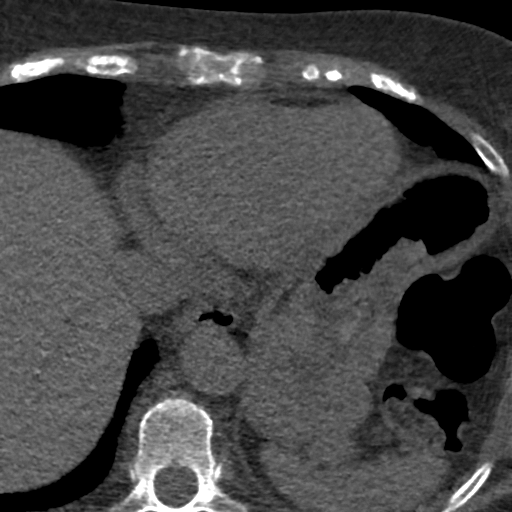
[im 9/45  lung]
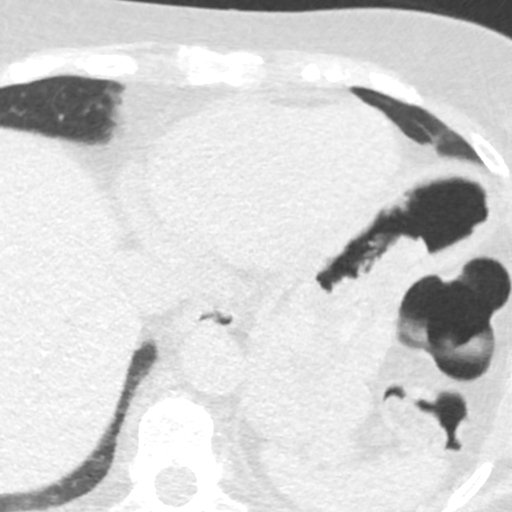
[im 18/45  vessel]
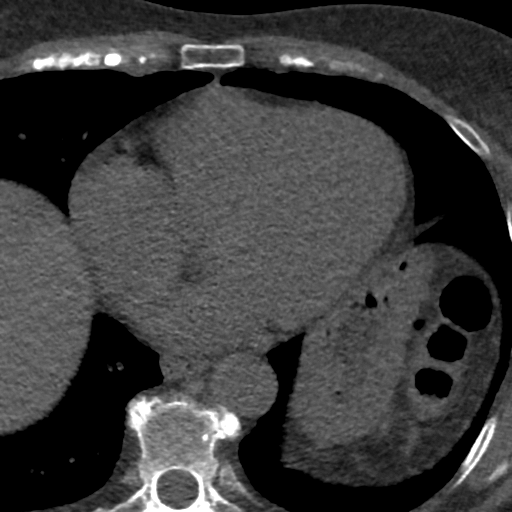
[im 27/45  vessel]
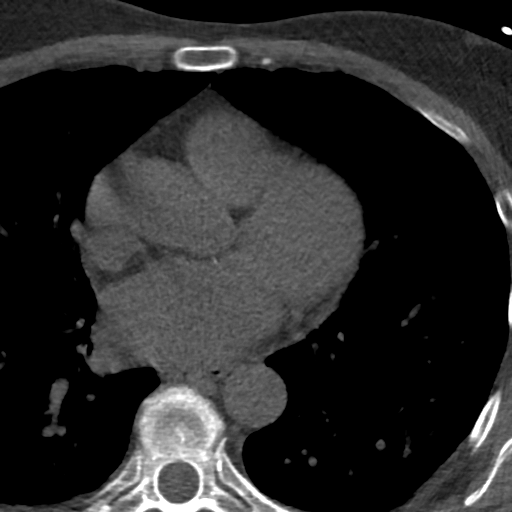
[im 36/45  vessel]
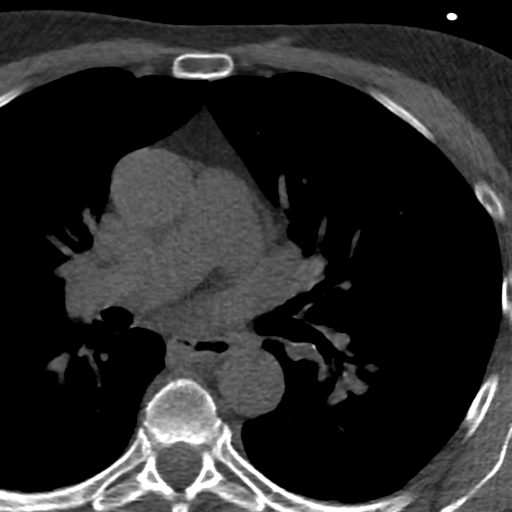

[Series 3: lung 71 % · axial · 0.63mm/px · z∈[-179,-92]mm · 5 of 45 slices shown]
[im 8/45  lung]
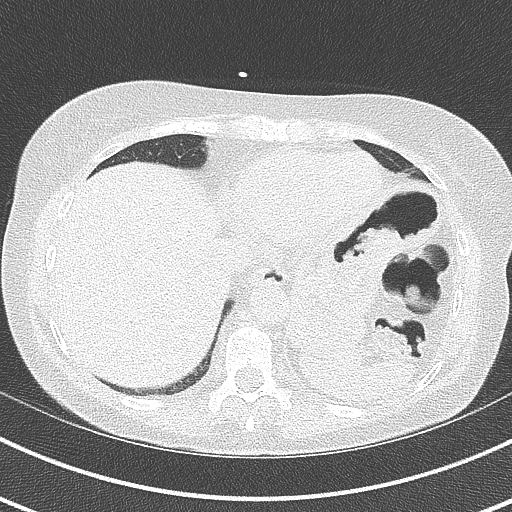
[im 15/45  lung]
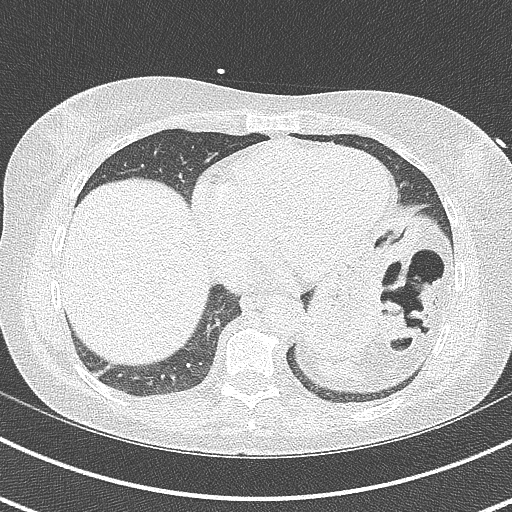
[im 23/45  lung]
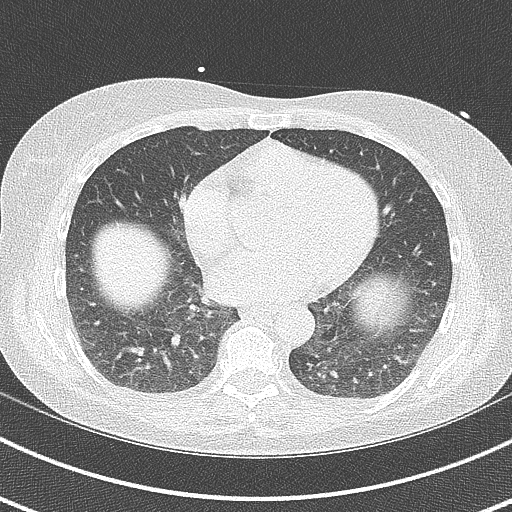
[im 30/45  lung]
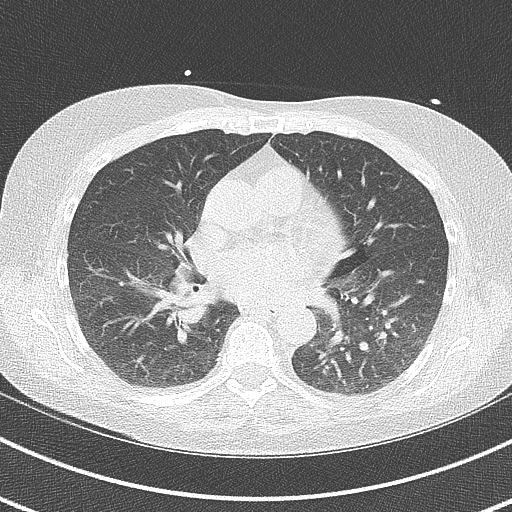
[im 37/45  lung]
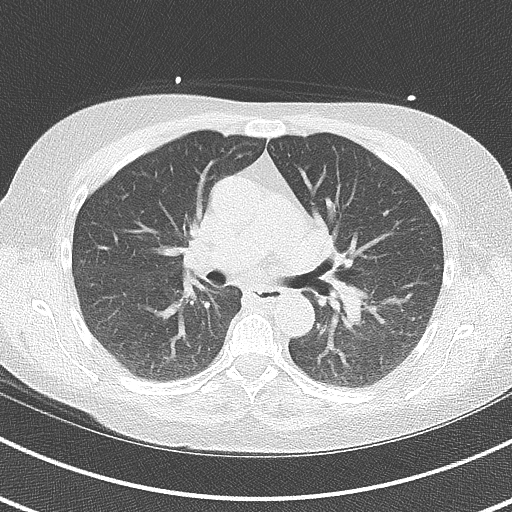

[Series 4: lung st 71 % · axial · 0.63mm/px · z∈[-179,-92]mm · 5 of 45 slices shown]
[im 8/45  lung]
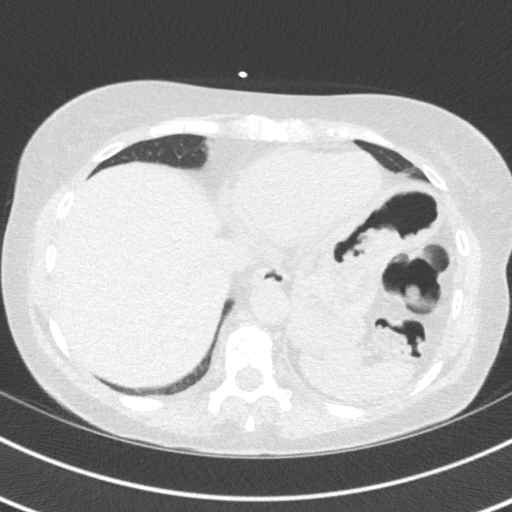
[im 15/45  lung]
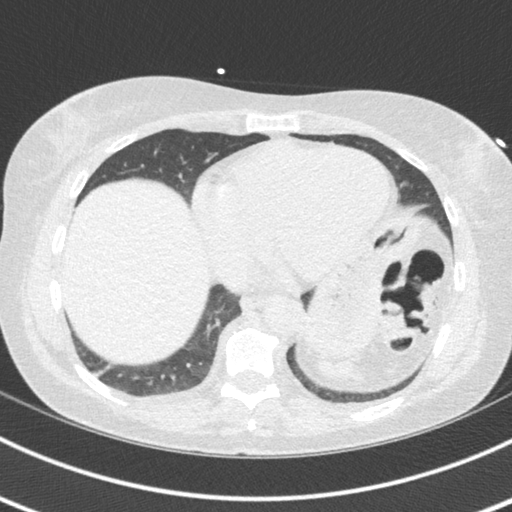
[im 23/45  lung]
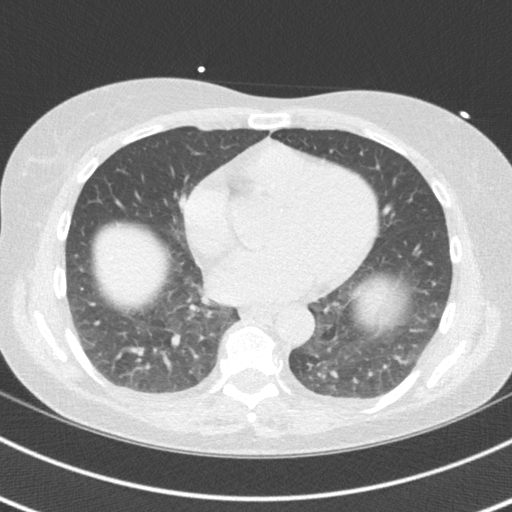
[im 30/45  lung]
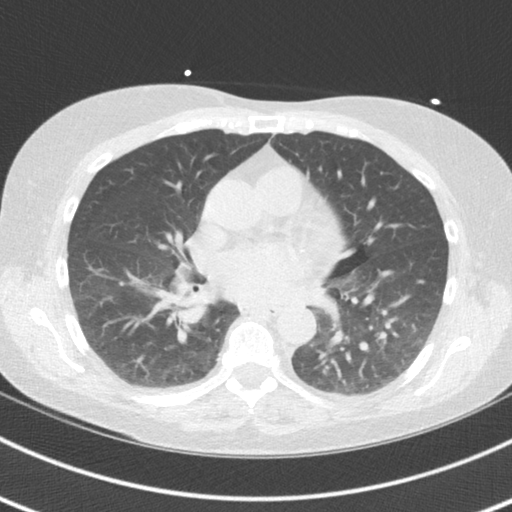
[im 37/45  lung]
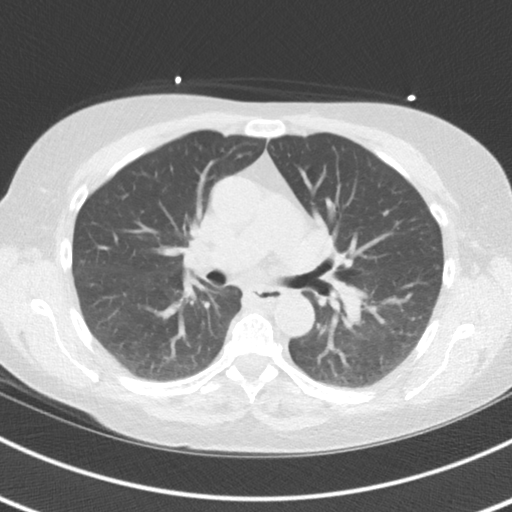

[14 of 20 positions shown; findings below may reference images not displayed]

FINDINGS: Vascular: No significant vascular findings. Normal heart size. No
pericardial effusion.

Mediastinum/Nodes: Visualized mediastinum and hilar regions
demonstrate no lymphadenopathy or masses.

Lungs/Pleura: Visualized lungs show no evidence of pulmonary edema,
consolidation, pneumothorax, nodule or pleural fluid.

Upper Abdomen: No acute abnormality.

Musculoskeletal: No chest wall mass or suspicious bone lesions
identified.
IMPRESSION: No significant incidental findings.
FINDINGS: Non-cardiac: See separate report from [REDACTED].

Ascending Aorta: Normal caliber

Pericardium: Normal

Coronary arteries: Normal origins.  Sylvere of calcium in the mid-LCx.
IMPRESSION: Coronary calcium score of 0.861. This was 55th percentile for age
and sex matched control.

*** End of Addendum ***
EXAM:
OVER-READ INTERPRETATION  CT CHEST

The following report is an over-read performed by radiologist Dr.
Pepenk Langga [REDACTED] on 06/26/2020. This
over-read does not include interpretation of cardiac or coronary
anatomy or pathology. The coronary calcium score interpretation by
the cardiologist is attached.
FINDINGS: Vascular: No significant vascular findings. Normal heart size. No
pericardial effusion.

Mediastinum/Nodes: Visualized mediastinum and hilar regions
demonstrate no lymphadenopathy or masses.

Lungs/Pleura: Visualized lungs show no evidence of pulmonary edema,
consolidation, pneumothorax, nodule or pleural fluid.

Upper Abdomen: No acute abnormality.

Musculoskeletal: No chest wall mass or suspicious bone lesions
identified.
IMPRESSION: No significant incidental findings.

## 2023-03-06 ENCOUNTER — Ambulatory Visit: Payer: Medicare HMO | Admitting: Podiatry

## 2023-03-20 ENCOUNTER — Ambulatory Visit (INDEPENDENT_AMBULATORY_CARE_PROVIDER_SITE_OTHER): Payer: Medicare HMO

## 2023-03-20 ENCOUNTER — Ambulatory Visit (INDEPENDENT_AMBULATORY_CARE_PROVIDER_SITE_OTHER): Payer: Medicare HMO | Admitting: Podiatry

## 2023-03-20 DIAGNOSIS — M778 Other enthesopathies, not elsewhere classified: Secondary | ICD-10-CM

## 2023-03-20 NOTE — Patient Instructions (Signed)
I have ordered a medication for you that will come from [facility] in [place]. They should be calling you to verify insurance and will mail the medication to you. If you live close by then you can go by their pharmacy to pick up the medication. Their phone number is [phone]. If you do not hear from them in the next few days, please give us a call at [phone].   

## 2023-03-20 NOTE — Progress Notes (Unsigned)
Subjective:   Patient ID: Kelli Taylor, female   DOB: 67 y.o.   MRN: [id number]   HPI Chief Complaint  Patient presents with   Nail Problem    RM#13 Right big toe nail hooked over/ Nail fungus.   67 year old female presents the office for above concerns.  She states she also has a metatarsal problem and she sees a [facility] for this as well.  She states her toes are contracted and it will help for a little bit but then the pain comes back.  She feels that she is walking this time she gets a callus area and all of her right foot she puts an over-the-counter salicylic acid pad over the area.  She said that many years ago was a wart.  She saw orthopedics who gave her some pads which kind reports she said as well.  Her main concern now is her toenails are thickened elongated mostly on the right big toenail.  No swelling redness or drainage.  Does cause discomfort at times.  Review of Systems  All other systems reviewed and are negative.  Past Medical History:  Diagnosis Date   Anxiety disorder    Dyslipidemia, goal to be determined 06/18/2020   Paroxysmal - nonsustained  SVT (supraventricular tachycardia) (HCC)    7-DAY ZIO PATCH CARDIAC EVENT MONITOR: 6 brief bursts of SVT/PAT.  Fastest was 9 beats at a rate of 156 bpm, longest 17 beats rate 137 bpm. --> NO SUSTAINED TACHYARRHYTHMIAS.(i.e. no SUSTAINTED SVT / PAT).  Rare isolated PACs or PVCs.  Short bursts of PAT were not noted on diary or triggered patient events.    Past Surgical History:  Procedure Laterality Date   CORONARY CALCIUM SCORE  06/26/2020   CCS 0.861 --> LOW.  speck of Ca2+ noted in mLCx. ~ 55th Percentile.   None     TRANSTHORACIC ECHOCARDIOGRAM  06/04/2020   [facility]): Normal left ventricular size and function.  EF estimated 7075%.  GR 1 DD.  Mild LA dilation.  Mild aortic sclerosis, no stenosis.  Although valve not well seen.  Mitral valve is structurally normal with trace MR, mild leaflet  calcification but no stenosis and no mitral valve prolapse.     Current Outpatient Medications:    Calcium-Cholecalciferol-Zinc (VIACTIV CALCIUM IMMUNE PO), Take by mouth., Disp: , Rfl:    Multiple Vitamin (MULTIVITAMIN) tablet, Take 1 tablet by mouth daily., Disp: , Rfl:    Multiple Vitamins-Minerals (EMERGEN-C BLUE) PACK, See admin instructions., Disp: , Rfl:    multivitamin-lutein (OCUVITE-LUTEIN) CAPS capsule, Take 1 capsule by mouth daily., Disp: , Rfl:    Pseudophed-Chlophedianol-GG 30-12.5-100 MG/5ML LIQD, Take by mouth., Disp: , Rfl:   Allergies  Allergen Reactions   Cinnamon Swelling           Objective:  Physical Exam  General: AAO x3, NAD  Dermatological: Nails are hypertrophic, dystrophic with yellow, brown discoloration.  The right hallux toenail particular skin is hypertrophic she has not incurvation of the nail borders.  There is no edema, erythema, drainage or pus or any signs of infection.  No open lesions noted.  Hyperkeratotic lesion submetatarsal on the right foot without any underlying ulceration, drainage or signs of infection.   Vascular: Dorsalis Pedis artery and Posterior Tibial artery pedal pulses are 2/4 bilateral with immedate capillary fill time.  There is no pain with calf compression, swelling, warmth, erythema.   Neruologic: Grossly intact via light touch bilateral.   Musculoskeletal: There are hammertoe contractures present bilaterally  with prominence of the metatarsals plantarly right side worse than left.  She has tenderness palpation on the toes and makes it difficult to wear shoes.  No area of pinpoint tenderness.   Assessment:   67 year old female with onychomycosis, onychodystrophy; digital contracture/deformity; callus due to prominent metatarsal head     Plan:  -Treatment options discussed including all alternatives, risks, and complications -Etiology of symptoms were discussed  Onychomycosis/onychodystrophy -Most notably is the  right hallux toenail.  We discussed the nail removal as well as conservative treatment.  She does not proceed with nail removal.  I sharply debrided the nail without any complications or bleeding.  I ordered a compound cream through [facility] to help the nail dystrophy as well as fungus.  If symptoms persist or worsen will need to proceed with nail removal.  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  Previous the feet were obtained.  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 -Discussed with conservative as well as surgical treatment options.  We discussed surgical invention including shortening metatarsals, hammertoe repair.  She was continued conservative treatment for now.  Discussed different types of inserts and metatarsal support to help.  Dispensed a single limb toe regulator to help hold the second toe down.  She can continue with the adjustments however discussed this is not a probably make anything go back in place without doing surgery unfortunately.  Return in about 2 months (around 05/21/2023).  Vivi Barrack DPM   Order cc does not want oral

## 2023-05-22 ENCOUNTER — Encounter: Payer: Self-pay | Admitting: Podiatry

## 2023-05-22 ENCOUNTER — Ambulatory Visit (INDEPENDENT_AMBULATORY_CARE_PROVIDER_SITE_OTHER): Payer: Medicare HMO | Admitting: Podiatry

## 2023-05-22 DIAGNOSIS — B351 Tinea unguium: Secondary | ICD-10-CM | POA: Diagnosis not present

## 2023-05-22 DIAGNOSIS — L84 Corns and callosities: Secondary | ICD-10-CM

## 2023-05-22 DIAGNOSIS — M216X1 Other acquired deformities of right foot: Secondary | ICD-10-CM | POA: Diagnosis not present

## 2023-05-22 NOTE — Patient Instructions (Signed)
 I have ordered a medication for you that will come from [place] in [place]. They should be calling you to verify insurance and will mail the medication to you. If you live close by then you can go by their pharmacy to pick up the medication. Their phone number is [phone]. If you do not hear from them in the next few days, please give [place] a call at [phone].

## 2023-05-24 NOTE — Progress Notes (Signed)
Subjective:   Patient ID: Kelli Taylor, female   DOB: 68 y.o.   MRN: [id number]   HPI Chief Complaint  Patient presents with   Nail Problem    RM#14 Right foot nail deformity wants to discuss treatment.    68 year old female presents the office for above concerns.  Saw her last she did not get the compound medication that she had some questions with the pharmacy but she never received a return call.  Therefore the nails are doing about the same.  She still gets pain along the area of a prominent metatarsal head, callus.  She is asked that there could be a wart present.  No open lesions.  No other concerns today.      Objective:  Physical Exam  General: AAO x3, NAD  Dermatological: Nails are hypertrophic, dystrophic with yellow, brown discoloration.  The right hallux toenail particular skin is hypertrophic she has not incurvation of the nail borders.  There is no edema, erythema, drainage or pus or any signs of infection.  No change to the toenail.  Hyperkeratotic lesion submetatarsal on the right foot without any underlying ulceration, drainage or signs of infection.  Upon debridement there is 1 small area of dried blood versus verruca.  There is no surrounding erythema, ascending cellulitis.  Vascular: Dorsalis Pedis artery and Posterior Tibial artery pedal pulses are 2/4 bilateral with immedate capillary fill time.  There is no pain with calf compression, swelling, warmth, erythema.   Neruologic: Grossly intact via light touch bilateral.   Musculoskeletal: There are hammertoe contractures present bilaterally with prominence of the metatarsals plantarly right side worse than left.  Prominent metatarsal heads.   Assessment:   68 year old female with onychomycosis, onychodystrophy; digital contracture/deformity; callus due to prominent metatarsal head     Plan:  -Treatment options discussed including all alternatives, risks, and complications -Etiology of symptoms were  discussed  Onychomycosis/onychodystrophy -Suggested debride the nails and complications of bleeding.  We discussed the medication as well as side effects of the medication.  I refaxed the order for nail fungus to [facility].   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  We discussed using over-the-counter wart remover as I do not want to use topical medication given minimal fat pad and I think it might help a little bit further But she has a diffuse callus which is causing more of her issue.  Continue moisturizer, offloading.  Monitor for any signs or symptoms of infection.  No follow-ups on file.  Vivi Barrack DPM

## 2023-07-27 ENCOUNTER — Ambulatory Visit (INDEPENDENT_AMBULATORY_CARE_PROVIDER_SITE_OTHER): Payer: Medicare HMO | Admitting: Podiatry

## 2023-07-27 DIAGNOSIS — B351 Tinea unguium: Secondary | ICD-10-CM | POA: Diagnosis not present

## 2023-07-27 DIAGNOSIS — D2371 Other benign neoplasm of skin of right lower limb, including hip: Secondary | ICD-10-CM | POA: Diagnosis not present

## 2023-07-27 NOTE — Patient Instructions (Signed)
 Take dressing off in 8 hours and wash the foot with soap and water. If it is hurting or becomes uncomfortable before the 8 hours, go ahead and remove the bandage and wash the area.  If it blisters, apply antibiotic ointment and a band-aid.  Monitor for any signs/symptoms of infection. Call the office immediately if any occur or go directly to the emergency room. Call with any questions/concerns.

## 2023-07-27 NOTE — Progress Notes (Signed)
 Subjective:   Patient ID: Kelli Taylor, female   DOB: 68 y.o.   MRN: [id number]   HPI Chief Complaint  Patient presents with   Nail Problem    RM#13 Follow up on nail fungus patient states having issues with corns on both feet causing pain.     68 year old female presents the office for above concerns.  States that she has not started using the topical medication yet.  She is having issues with calluses and she thinks warts on the bottom of her right foot.  She did try salicylic acid to this area.  She is also circular callus on the ball of her left foot.        Objective:  Physical Exam  General: AAO x3, NAD  Dermatological: Nails are hypertrophic, dystrophic with yellow, brown discoloration. The nails appear to be unchanged.  On the right foot submetatarsal 3 is a annular hyperkeratotic lesion into minimal degree of the left side.  There is no underlying ulceration, drainage or signs of infection.  Vascular: Dorsalis Pedis artery and Posterior Tibial artery pedal pulses are 2/4 bilateral with immedate capillary fill time.  There is no pain with calf compression, swelling, warmth, erythema.   Neruologic: Grossly intact via light touch bilateral.   Musculoskeletal: There are hammertoe contractures present bilaterally with prominence of the metatarsals plantarly right side worse than left.  Prominent metatarsal heads.   Assessment:   68 year old female with onychomycosis, onychodystrophy; digital contracture/deformity; callus due to prominent metatarsal head     Plan:  -Treatment options discussed including all alternatives, risks, and complications -Etiology of symptoms were discussed  Onychomycosis/onychodystrophy - She is going to start using the compound cream through [facility].  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  The right foot I cleaned the skin  with alcohol and Cantharone was applied followed by occlusive bandage.  Postprocedure instructions discussed.  Monitoring signs or symptoms of infection. -Continue offloading.  Return in about 2 months (around 09/26/2023).  Charity Conch DPM

## 2023-09-18 ENCOUNTER — Other Ambulatory Visit

## 2023-10-03 ENCOUNTER — Other Ambulatory Visit

## 2023-10-03 ENCOUNTER — Ambulatory Visit (INDEPENDENT_AMBULATORY_CARE_PROVIDER_SITE_OTHER): Admitting: Podiatry

## 2023-10-03 DIAGNOSIS — M2042 Other hammer toe(s) (acquired), left foot: Secondary | ICD-10-CM | POA: Diagnosis not present

## 2023-10-03 DIAGNOSIS — B351 Tinea unguium: Secondary | ICD-10-CM

## 2023-10-03 DIAGNOSIS — M2041 Other hammer toe(s) (acquired), right foot: Secondary | ICD-10-CM | POA: Diagnosis not present

## 2023-10-03 DIAGNOSIS — M778 Other enthesopathies, not elsewhere classified: Secondary | ICD-10-CM

## 2023-10-03 DIAGNOSIS — M216X1 Other acquired deformities of right foot: Secondary | ICD-10-CM

## 2023-10-03 NOTE — Patient Instructions (Signed)

## 2023-10-03 NOTE — Progress Notes (Unsigned)
 Orthotics   Patient was present and evaluated for Custom molded foot orthotics. Patient will benefit from CFO's to provide total contact to BIL MLA's helping to balance and distribute body weight more evenly across BIL feet helping to reduce plantar pressure and pain. Orthotic will also encourage FF / RF alignment  Patient was scanned today and will return for fitting upon receipt   Measure legs for possible heel lift when picking up inserts

## 2023-10-04 NOTE — Progress Notes (Signed)
 Subjective:   Patient ID: Kelli Taylor, female   DOB: 68 y.o.   MRN: [id number]   HPI Chief Complaint  Patient presents with   Nail Problem    RM#13 Patient states here for nail check for fungus is using treatment and is now seeing results.Patient states had reaction to wart treatment last appointment and doesn't want to have it applied at this time.    68 year old female presents the office for above concerns.  She states that she has started using topical medication she does feel there has been improvement.  However the right second toenail is thick and she is asking to have it filed down to the medication gait and they are better.  Does not report any open lesions or any other changes.   She says the Cantharone burned so she does not want that applied again today so she been using over-the-counter Compound W which has been helping.  She is asking if I can trim some of the skin around this today.    She is also following up today with Sueanne, pedorthist for inserts.          Objective:  Physical Exam  General: AAO x3, NAD  Dermatological: Nails are hypertrophic, dystrophic with yellow, brown discoloration. The nails appear to be improved as far as the color.  The right second toenail is hypertrophic.  Is no edema, erythema.  Submetatarsal 2 of the right foot on there is skin lesion of the macerated tissue along this on the periphery.  There is no drainage or pus.  No erythema or warmth.  No open lesions.   Vascular: Dorsalis Pedis artery and Posterior Tibial artery pedal pulses are 2/4 bilateral with immedate capillary fill time.  There is no pain with calf compression, swelling, warmth, erythema.   Neruologic: Grossly intact via light touch bilateral.   Musculoskeletal: There are hammertoe contractures present bilaterally with prominence of the metatarsals plantarly right side worse than left.  Prominent metatarsal heads.   Assessment:   68 year old female with onychomycosis,  onychodystrophy; digital contracture/deformity; callus due to prominent metatarsal head     Plan:  -Treatment options discussed including all alternatives, risks, and complications -Etiology of symptoms were discussed  Onychomycosis/onychodystrophy - Sharply debrided the nails as a courtesy with any complications or bleeding.  Continue with topical medication.  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  There was not much to trim today.  We dispensed gel metatarsal pads to help offload the problem metatarsal heads.  She has also presented for orthotics.  See separate note for this.  Hammertoe - Discussed only with a permanent correct this will be to do surgery.  Continue offloading.  Return in about 3 months (around 01/03/2024).  Donnice JONELLE Fees DPM

## 2023-10-31 ENCOUNTER — Ambulatory Visit

## 2023-10-31 NOTE — Progress Notes (Signed)
 Patient presents today to pick up custom molded foot orthotics, diagnosed with Capsulitis foot by Dr. Gershon.   Orthotics were dispensed and fit was satisfactory. Reviewed instructions for break-in and wear. Written instructions given to patient.  Patient will follow up as needed.  Lolita Schultze CPed, CFo, CFm

## 2024-01-04 ENCOUNTER — Ambulatory Visit: Admitting: Podiatry

## 2024-01-04 VITALS — Ht 60.0 in

## 2024-01-04 DIAGNOSIS — M2041 Other hammer toe(s) (acquired), right foot: Secondary | ICD-10-CM

## 2024-01-04 DIAGNOSIS — M2042 Other hammer toe(s) (acquired), left foot: Secondary | ICD-10-CM

## 2024-01-04 DIAGNOSIS — B351 Tinea unguium: Secondary | ICD-10-CM

## 2024-01-04 DIAGNOSIS — M216X1 Other acquired deformities of right foot: Secondary | ICD-10-CM

## 2024-01-07 NOTE — Progress Notes (Signed)
 Subjective:   Patient ID: Kelli Taylor, female   DOB: 68 y.o.   MRN: [id number]   HPI Chief Complaint  Patient presents with   Nail Problem    Rm 12 Pt is here to f/u on dermatophytosis.    68 year old female presents the office for above concerns.  She states that she has started using topical medication she does feel there has been improvement.  They also get thick and they cause discomfort.  Still has a callus, possible wart on the bottom of her foot she previously was using Compound W.  She is in certain adjustments.     Objective:  Physical Exam  General: AAO x3, NAD  Dermatological: Nails are hypertrophic, dystrophic with yellow, brown discoloration. The nails appear to be improved as far as the color.  The right second toenail is hypertrophic.  Is no edema, erythema.  Submetatarsal 2 of the right foot on there is skin lesion of the macerated tissue along this on the periphery.  There is no drainage or pus.  No erythema or warmth.  No open lesions.   Vascular: Dorsalis Pedis artery and Posterior Tibial artery pedal pulses are 2/4 bilateral with immedate capillary fill time.  There is no pain with calf compression, swelling, warmth, erythema.   Neruologic: Grossly intact via light touch bilateral.   Musculoskeletal: There are hammertoe contractures present bilaterally with prominence of the metatarsals plantarly right side worse than left.  Prominent metatarsal heads.   Assessment:   68 year old female with onychomycosis, onychodystrophy; digital contracture/deformity; callus due to prominent metatarsal head     Plan:  -Treatment options discussed including all alternatives, risks, and complications -Etiology of symptoms were discussed  Onychomycosis/onychodystrophy - Sharply debrided the nails as a courtesy without any complications or bleeding.  Continue with topical medication.  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  There was not much to trim today.  We dispensed gel metatarsal pads to help offload the problem metatarsal heads.  She has also presented today for orthotic adjustments and she was seen by [place].  See separate note for this.  Return in about 2 months (around 03/06/2024).   Donnice JONELLE Fees DPM

## 2024-03-14 ENCOUNTER — Ambulatory Visit: Admitting: Podiatry
